# Patient Record
Sex: Male | Born: 1935 | Race: White | Hispanic: No
Health system: Southern US, Community
[De-identification: ages and names within clinical notes are randomized; demographics above are authoritative.]

---

## 2002-08-21 ENCOUNTER — Encounter: Admission: RE | Admit: 2002-08-21 | Discharge: 2002-08-21 | Payer: Self-pay | Admitting: Unknown Physician Specialty

## 2002-08-21 ENCOUNTER — Encounter: Payer: Self-pay | Admitting: Unknown Physician Specialty

## 2003-02-17 ENCOUNTER — Encounter: Admission: RE | Admit: 2003-02-17 | Discharge: 2003-02-17 | Payer: Self-pay | Admitting: Neurosurgery

## 2003-02-17 ENCOUNTER — Encounter: Payer: Self-pay | Admitting: Neurosurgery

## 2003-03-20 ENCOUNTER — Encounter: Payer: Self-pay | Admitting: Neurosurgery

## 2003-03-25 ENCOUNTER — Encounter: Payer: Self-pay | Admitting: Neurosurgery

## 2003-03-25 ENCOUNTER — Inpatient Hospital Stay (HOSPITAL_COMMUNITY): Admission: RE | Admit: 2003-03-25 | Discharge: 2003-03-26 | Payer: Self-pay | Admitting: Neurosurgery

## 2003-04-15 ENCOUNTER — Encounter: Admission: RE | Admit: 2003-04-15 | Discharge: 2003-04-15 | Payer: Self-pay | Admitting: Neurosurgery

## 2003-04-15 ENCOUNTER — Encounter: Payer: Self-pay | Admitting: Neurosurgery

## 2003-09-25 ENCOUNTER — Inpatient Hospital Stay (HOSPITAL_COMMUNITY): Admission: RE | Admit: 2003-09-25 | Discharge: 2003-09-26 | Payer: Self-pay | Admitting: Neurosurgery

## 2004-09-10 ENCOUNTER — Ambulatory Visit: Payer: Self-pay | Admitting: Gastroenterology

## 2006-02-23 ENCOUNTER — Ambulatory Visit: Payer: Self-pay | Admitting: Ophthalmology

## 2006-02-28 ENCOUNTER — Ambulatory Visit: Payer: Self-pay | Admitting: Ophthalmology

## 2010-06-07 ENCOUNTER — Emergency Department: Payer: Self-pay | Admitting: Emergency Medicine

## 2011-02-22 ENCOUNTER — Emergency Department: Payer: Self-pay | Admitting: Emergency Medicine

## 2011-02-24 ENCOUNTER — Ambulatory Visit: Payer: Self-pay | Admitting: Cardiothoracic Surgery

## 2011-03-06 ENCOUNTER — Ambulatory Visit: Payer: Self-pay | Admitting: Cardiothoracic Surgery

## 2011-10-13 ENCOUNTER — Inpatient Hospital Stay: Payer: Self-pay | Admitting: Family Medicine

## 2011-10-13 LAB — CBC
HGB: 14.8 g/dL (ref 13.0–18.0)
Platelet: 242 10*3/uL (ref 150–440)
RBC: 4.59 10*6/uL (ref 4.40–5.90)
WBC: 9.9 10*3/uL (ref 3.8–10.6)

## 2011-10-13 LAB — CK TOTAL AND CKMB (NOT AT ARMC)
CK, Total: 151 U/L (ref 35–232)
CK-MB: 4.1 ng/mL — ABNORMAL HIGH (ref 0.5–3.6)

## 2011-10-13 LAB — CBC WITH DIFFERENTIAL/PLATELET
Basophil #: 0.1 10*3/uL (ref 0.0–0.1)
Basophil %: 0.8 %
Eosinophil #: 0.1 10*3/uL (ref 0.0–0.7)
HCT: 40.6 % (ref 40.0–52.0)
Lymphocyte %: 7.2 %
MCHC: 32.8 g/dL (ref 32.0–36.0)
Monocyte #: 0.4 10*3/uL (ref 0.0–0.7)
Neutrophil #: 5.9 10*3/uL (ref 1.4–6.5)
RDW: 18.8 % — ABNORMAL HIGH (ref 11.5–14.5)
WBC: 7 10*3/uL (ref 3.8–10.6)

## 2011-10-13 LAB — TSH: Thyroid Stimulating Horm: 1.82 u[IU]/mL

## 2011-10-13 LAB — COMPREHENSIVE METABOLIC PANEL
Albumin: 3.9 g/dL (ref 3.4–5.0)
Anion Gap: 15 (ref 7–16)
Bilirubin,Total: 2.4 mg/dL — ABNORMAL HIGH (ref 0.2–1.0)
Calcium, Total: 9.2 mg/dL (ref 8.5–10.1)
Glucose: 108 mg/dL — ABNORMAL HIGH (ref 65–99)
Osmolality: 289 (ref 275–301)
Sodium: 140 mmol/L (ref 136–145)
Total Protein: 7.6 g/dL (ref 6.4–8.2)

## 2011-10-13 LAB — PROTIME-INR
INR: 1.4
Prothrombin Time: 17.1 secs — ABNORMAL HIGH (ref 11.5–14.7)

## 2011-10-13 LAB — PRO B NATRIURETIC PEPTIDE: B-Type Natriuretic Peptide: 14579 pg/mL — ABNORMAL HIGH (ref 0–450)

## 2011-10-13 LAB — MAGNESIUM: Magnesium: 1.9 mg/dL

## 2011-10-13 LAB — TROPONIN I: Troponin-I: 0.05 ng/mL

## 2011-10-13 LAB — PHOSPHORUS: Phosphorus: 3 mg/dL (ref 2.5–4.9)

## 2011-10-14 LAB — URINALYSIS, COMPLETE
Bacteria: NONE SEEN
Bilirubin,UR: NEGATIVE
Blood: NEGATIVE
Glucose,UR: NEGATIVE mg/dL (ref 0–75)
Ketone: NEGATIVE
Leukocyte Esterase: NEGATIVE
Ph: 6 (ref 4.5–8.0)
Specific Gravity: 1.005 (ref 1.003–1.030)
Squamous Epithelial: NONE SEEN

## 2011-10-14 LAB — CBC WITH DIFFERENTIAL/PLATELET
Basophil #: 0 10*3/uL (ref 0.0–0.1)
Eosinophil #: 0.3 10*3/uL (ref 0.0–0.7)
HGB: 12.4 g/dL — ABNORMAL LOW (ref 13.0–18.0)
Lymphocyte %: 7 %
MCHC: 32.6 g/dL (ref 32.0–36.0)
Monocyte #: 0.5 10*3/uL (ref 0.0–0.7)
Monocyte %: 6.5 %
Neutrophil #: 6.2 10*3/uL (ref 1.4–6.5)
Neutrophil %: 82.2 %
Platelet: 187 10*3/uL (ref 150–440)
RBC: 3.81 10*6/uL — ABNORMAL LOW (ref 4.40–5.90)
RDW: 19.1 % — ABNORMAL HIGH (ref 11.5–14.5)
WBC: 7.5 10*3/uL (ref 3.8–10.6)

## 2011-10-14 LAB — DRUG SCREEN, URINE
Amphetamines, Ur Screen: NEGATIVE (ref ?–1000)
Benzodiazepine, Ur Scrn: NEGATIVE (ref ?–200)
Cocaine Metabolite,Ur ~~LOC~~: NEGATIVE (ref ?–300)
MDMA (Ecstasy)Ur Screen: NEGATIVE (ref ?–500)
Methadone, Ur Screen: NEGATIVE (ref ?–300)
Opiate, Ur Screen: NEGATIVE (ref ?–300)
Phencyclidine (PCP) Ur S: NEGATIVE (ref ?–25)
Tricyclic, Ur Screen: NEGATIVE (ref ?–1000)

## 2011-10-14 LAB — BASIC METABOLIC PANEL
Anion Gap: 13 (ref 7–16)
BUN: 30 mg/dL — ABNORMAL HIGH (ref 7–18)
Calcium, Total: 7.9 mg/dL — ABNORMAL LOW (ref 8.5–10.1)
Chloride: 100 mmol/L (ref 98–107)
Co2: 29 mmol/L (ref 21–32)
Creatinine: 1.24 mg/dL (ref 0.60–1.30)
EGFR (African American): >60
Potassium: 3.3 mmol/L — ABNORMAL LOW (ref 3.5–5.1)
Sodium: 142 mmol/L (ref 136–145)

## 2011-10-14 LAB — CK TOTAL AND CKMB (NOT AT ARMC)
CK, Total: 136 U/L (ref 35–232)
CK-MB: 3.2 ng/mL (ref 0.5–3.6)

## 2011-10-14 LAB — TROPONIN I: Troponin-I: 0.05 ng/mL

## 2011-10-14 LAB — ETHANOL: Ethanol %: <0.003 % (ref 0.000–0.080)

## 2011-10-15 LAB — BASIC METABOLIC PANEL
Calcium, Total: 8 mg/dL — ABNORMAL LOW (ref 8.5–10.1)
Chloride: 99 mmol/L (ref 98–107)
Co2: 29 mmol/L (ref 21–32)
EGFR (African American): >60
EGFR (Non-African Amer.): 54 — ABNORMAL LOW
Potassium: 3.9 mmol/L (ref 3.5–5.1)
Sodium: 138 mmol/L (ref 136–145)

## 2011-10-15 LAB — CBC WITH DIFFERENTIAL/PLATELET
Basophil #: 0.1 10*3/uL (ref 0.0–0.1)
Eosinophil #: 0.4 10*3/uL (ref 0.0–0.7)
Eosinophil %: 4.7 %
HCT: 38.5 % — ABNORMAL LOW (ref 40.0–52.0)
Lymphocyte #: 0.6 10*3/uL — ABNORMAL LOW (ref 1.0–3.6)
Lymphocyte %: 7.9 %
MCH: 32.1 pg (ref 26.0–34.0)
MCHC: 32.3 g/dL (ref 32.0–36.0)
MCV: 100 fL (ref 80–100)
Monocyte #: 0.5 10*3/uL (ref 0.0–0.7)
Neutrophil #: 6.5 10*3/uL (ref 1.4–6.5)
Platelet: 194 10*3/uL (ref 150–440)
RDW: 19.5 % — ABNORMAL HIGH (ref 11.5–14.5)
WBC: 8 10*3/uL (ref 3.8–10.6)

## 2011-10-15 LAB — APTT
Activated PTT: 116.2 secs — ABNORMAL HIGH (ref 23.6–35.9)
Activated PTT: 103.5 secs — ABNORMAL HIGH (ref 23.6–35.9)

## 2011-10-16 LAB — BASIC METABOLIC PANEL
Anion Gap: 11 (ref 7–16)
BUN: 21 mg/dL — ABNORMAL HIGH (ref 7–18)
Calcium, Total: 8.2 mg/dL — ABNORMAL LOW (ref 8.5–10.1)
Co2: 32 mmol/L (ref 21–32)
EGFR (African American): >60
Glucose: 141 mg/dL — ABNORMAL HIGH (ref 65–99)

## 2011-10-16 LAB — MAGNESIUM: Magnesium: 1 mg/dL — ABNORMAL LOW

## 2011-10-16 LAB — APTT: Activated PTT: 80.1 secs — ABNORMAL HIGH (ref 23.6–35.9)

## 2011-10-17 LAB — HEPATIC FUNCTION PANEL A (ARMC)
Albumin: 3 g/dL — ABNORMAL LOW (ref 3.4–5.0)
Bilirubin,Total: 1.2 mg/dL — ABNORMAL HIGH (ref 0.2–1.0)
SGOT(AST): 54 U/L — ABNORMAL HIGH (ref 15–37)
SGPT (ALT): 135 U/L — ABNORMAL HIGH
Total Protein: 6.7 g/dL (ref 6.4–8.2)

## 2011-10-17 LAB — BASIC METABOLIC PANEL
Anion Gap: 10 (ref 7–16)
Calcium, Total: 8.4 mg/dL — ABNORMAL LOW (ref 8.5–10.1)
Chloride: 94 mmol/L — ABNORMAL LOW (ref 98–107)
Creatinine: 1.13 mg/dL (ref 0.60–1.30)
EGFR (African American): >60
EGFR (Non-African Amer.): >60
Glucose: 124 mg/dL — ABNORMAL HIGH (ref 65–99)
Sodium: 134 mmol/L — ABNORMAL LOW (ref 136–145)

## 2011-10-17 LAB — PROTIME-INR
INR: 1
Prothrombin Time: 13.4 secs (ref 11.5–14.7)

## 2011-10-18 LAB — CBC WITH DIFFERENTIAL/PLATELET
Eosinophil #: 0.4 10*3/uL (ref 0.0–0.7)
Eosinophil %: 5.4 %
Lymphocyte #: 0.9 10*3/uL — ABNORMAL LOW (ref 1.0–3.6)
MCHC: 32.9 g/dL (ref 32.0–36.0)
Monocyte #: 0.7 10*3/uL (ref 0.0–0.7)
Neutrophil #: 6.2 10*3/uL (ref 1.4–6.5)
Neutrophil %: 74.9 %
RDW: 19.7 % — ABNORMAL HIGH (ref 11.5–14.5)
WBC: 8.3 10*3/uL (ref 3.8–10.6)

## 2011-10-18 LAB — BASIC METABOLIC PANEL
Calcium, Total: 8.6 mg/dL (ref 8.5–10.1)
Chloride: 95 mmol/L — ABNORMAL LOW (ref 98–107)
Co2: 32 mmol/L (ref 21–32)
Creatinine: 1.21 mg/dL (ref 0.60–1.30)
EGFR (African American): >60
EGFR (Non-African Amer.): >60
Osmolality: 271 (ref 275–301)
Potassium: 3.9 mmol/L (ref 3.5–5.1)
Sodium: 133 mmol/L — ABNORMAL LOW (ref 136–145)

## 2011-10-18 LAB — PROTIME-INR
INR: 1.3
Prothrombin Time: 16.2 secs — ABNORMAL HIGH (ref 11.5–14.7)

## 2011-10-18 LAB — MAGNESIUM: Magnesium: 1.7 mg/dL — ABNORMAL LOW

## 2011-10-19 LAB — PROTIME-INR: Prothrombin Time: 16.7 secs — ABNORMAL HIGH (ref 11.5–14.7)

## 2011-10-19 LAB — BASIC METABOLIC PANEL
Anion Gap: 9 (ref 7–16)
BUN: 21 mg/dL — ABNORMAL HIGH (ref 7–18)
Chloride: 94 mmol/L — ABNORMAL LOW (ref 98–107)
Co2: 30 mmol/L (ref 21–32)
Osmolality: 271 (ref 275–301)

## 2011-11-24 ENCOUNTER — Inpatient Hospital Stay: Payer: Self-pay | Admitting: Family Medicine

## 2011-11-24 LAB — CBC
HGB: 12.5 g/dL — ABNORMAL LOW (ref 13.0–18.0)
MCV: 92 fL (ref 80–100)
Platelet: 223 10*3/uL (ref 150–440)
RBC: 4.15 10*6/uL — ABNORMAL LOW (ref 4.40–5.90)
WBC: 7.2 10*3/uL (ref 3.8–10.6)

## 2011-11-24 LAB — COMPREHENSIVE METABOLIC PANEL
Albumin: 3.2 g/dL — ABNORMAL LOW (ref 3.4–5.0)
Alkaline Phosphatase: 75 U/L (ref 50–136)
BUN: 20 mg/dL — ABNORMAL HIGH (ref 7–18)
Bilirubin,Total: 0.7 mg/dL (ref 0.2–1.0)
Chloride: 99 mmol/L (ref 98–107)
Co2: 27 mmol/L (ref 21–32)
Creatinine: 1.06 mg/dL (ref 0.60–1.30)
EGFR (African American): >60
EGFR (Non-African Amer.): >60
Osmolality: 281 (ref 275–301)
Potassium: 4.2 mmol/L (ref 3.5–5.1)
SGPT (ALT): 13 U/L
Sodium: 139 mmol/L (ref 136–145)
Total Protein: 7.2 g/dL (ref 6.4–8.2)

## 2011-11-24 LAB — TROPONIN I: Troponin-I: <0.02 ng/mL

## 2011-11-24 LAB — CK TOTAL AND CKMB (NOT AT ARMC)
CK, Total: 97 U/L (ref 35–232)
CK-MB: 1 ng/mL (ref 0.5–3.6)

## 2011-11-25 LAB — BASIC METABOLIC PANEL
BUN: 19 mg/dL — ABNORMAL HIGH (ref 7–18)
Chloride: 100 mmol/L (ref 98–107)
Co2: 30 mmol/L (ref 21–32)
Creatinine: 1.11 mg/dL (ref 0.60–1.30)
EGFR (African American): >60
EGFR (Non-African Amer.): >60
Glucose: 139 mg/dL — ABNORMAL HIGH (ref 65–99)
Potassium: 4.2 mmol/L (ref 3.5–5.1)

## 2011-11-25 LAB — URINALYSIS, COMPLETE
Bacteria: NONE SEEN
Blood: NEGATIVE
Glucose,UR: NEGATIVE mg/dL (ref 0–75)
Ketone: NEGATIVE
Nitrite: POSITIVE
Protein: NEGATIVE
RBC,UR: 2 /HPF (ref 0–5)
Squamous Epithelial: <1

## 2011-11-25 LAB — TROPONIN I: Troponin-I: <0.02 ng/mL

## 2011-11-25 LAB — CK TOTAL AND CKMB (NOT AT ARMC): CK, Total: 71 U/L (ref 35–232)

## 2011-11-26 LAB — BASIC METABOLIC PANEL
Anion Gap: 8 (ref 7–16)
BUN: 17 mg/dL (ref 7–18)
Co2: 32 mmol/L (ref 21–32)
Creatinine: 1.07 mg/dL (ref 0.60–1.30)
EGFR (African American): >60
EGFR (Non-African Amer.): >60
Sodium: 134 mmol/L — ABNORMAL LOW (ref 136–145)

## 2011-11-26 LAB — CBC WITH DIFFERENTIAL/PLATELET
Basophil #: 0 10*3/uL (ref 0.0–0.1)
Basophil %: 0.4 %
Eosinophil #: 0.4 10*3/uL (ref 0.0–0.7)
Lymphocyte #: 1.5 10*3/uL (ref 1.0–3.6)
MCH: 29.9 pg (ref 26.0–34.0)
MCHC: 32.4 g/dL (ref 32.0–36.0)
MCV: 92 fL (ref 80–100)
Monocyte #: 0.5 10*3/uL (ref 0.0–0.7)
Monocyte %: 7.7 %
Neutrophil #: 4.5 10*3/uL (ref 1.4–6.5)
RBC: 4.31 10*6/uL — ABNORMAL LOW (ref 4.40–5.90)
RDW: 23.6 % — ABNORMAL HIGH (ref 11.5–14.5)

## 2011-11-26 LAB — PROTIME-INR: INR: 2.5

## 2011-11-28 LAB — BASIC METABOLIC PANEL
BUN: 17 mg/dL (ref 7–18)
Chloride: 96 mmol/L — ABNORMAL LOW (ref 98–107)
Co2: 31 mmol/L (ref 21–32)
EGFR (Non-African Amer.): >60
Osmolality: 277 (ref 275–301)
Potassium: 3.7 mmol/L (ref 3.5–5.1)

## 2011-11-28 LAB — CBC WITH DIFFERENTIAL/PLATELET
Basophil #: 0.1 10*3/uL (ref 0.0–0.1)
HGB: 12.4 g/dL — ABNORMAL LOW (ref 13.0–18.0)
Lymphocyte #: 1.1 10*3/uL (ref 1.0–3.6)
MCH: 29.7 pg (ref 26.0–34.0)
MCHC: 32 g/dL (ref 32.0–36.0)
MCV: 93 fL (ref 80–100)
Neutrophil #: 4.3 10*3/uL (ref 1.4–6.5)
Neutrophil %: 67.1 %
RBC: 4.18 10*6/uL — ABNORMAL LOW (ref 4.40–5.90)
RDW: 22.8 % — ABNORMAL HIGH (ref 11.5–14.5)
WBC: 6.4 10*3/uL (ref 3.8–10.6)

## 2011-11-28 LAB — MAGNESIUM: Magnesium: 2 mg/dL

## 2011-11-29 LAB — CBC WITH DIFFERENTIAL/PLATELET
Basophil #: 0.1 10*3/uL (ref 0.0–0.1)
Eosinophil #: 0.4 10*3/uL (ref 0.0–0.7)
Eosinophil %: 7 %
HCT: 37.6 % — ABNORMAL LOW (ref 40.0–52.0)
Lymphocyte #: 1.1 10*3/uL (ref 1.0–3.6)
Lymphocyte %: 18.2 %
MCH: 30.3 pg (ref 26.0–34.0)
MCHC: 32.9 g/dL (ref 32.0–36.0)
MCV: 92 fL (ref 80–100)
Monocyte %: 8.4 %
Neutrophil %: 64.8 %
RBC: 4.09 10*6/uL — ABNORMAL LOW (ref 4.40–5.90)
RDW: 22.9 % — ABNORMAL HIGH (ref 11.5–14.5)
WBC: 6.2 10*3/uL (ref 3.8–10.6)

## 2011-11-29 LAB — BASIC METABOLIC PANEL
BUN: 17 mg/dL (ref 7–18)
Chloride: 98 mmol/L (ref 98–107)
Co2: 28 mmol/L (ref 21–32)
EGFR (African American): >60
EGFR (Non-African Amer.): >60
Glucose: 147 mg/dL — ABNORMAL HIGH (ref 65–99)
Osmolality: 280 (ref 275–301)
Potassium: 3.9 mmol/L (ref 3.5–5.1)
Sodium: 138 mmol/L (ref 136–145)

## 2011-11-29 LAB — PROTIME-INR: Prothrombin Time: 33.5 secs — ABNORMAL HIGH (ref 11.5–14.7)

## 2011-11-30 LAB — BASIC METABOLIC PANEL
Anion Gap: 8 (ref 7–16)
BUN: 16 mg/dL (ref 7–18)
Calcium, Total: 9.4 mg/dL (ref 8.5–10.1)
Chloride: 98 mmol/L (ref 98–107)
Creatinine: 1.12 mg/dL (ref 0.60–1.30)
EGFR (African American): >60
EGFR (Non-African Amer.): >60
Glucose: 118 mg/dL — ABNORMAL HIGH (ref 65–99)
Potassium: 4.9 mmol/L (ref 3.5–5.1)
Sodium: 136 mmol/L (ref 136–145)

## 2011-11-30 LAB — CBC WITH DIFFERENTIAL/PLATELET
Basophil #: 0 10*3/uL (ref 0.0–0.1)
Basophil %: 0.5 %
HCT: 37.8 % — ABNORMAL LOW (ref 40.0–52.0)
Lymphocyte %: 19.3 %
MCHC: 32.2 g/dL (ref 32.0–36.0)
Monocyte #: 0.6 10*3/uL (ref 0.0–0.7)
Monocyte %: 8.4 %
Neutrophil #: 4.5 10*3/uL (ref 1.4–6.5)
Platelet: 240 10*3/uL (ref 150–440)
RBC: 4.08 10*6/uL — ABNORMAL LOW (ref 4.40–5.90)
RDW: 22.7 % — ABNORMAL HIGH (ref 11.5–14.5)
WBC: 6.9 10*3/uL (ref 3.8–10.6)

## 2011-11-30 LAB — PROTIME-INR: Prothrombin Time: 32.3 secs — ABNORMAL HIGH (ref 11.5–14.7)

## 2011-11-30 LAB — CULTURE, BLOOD (SINGLE)

## 2011-12-01 LAB — BASIC METABOLIC PANEL
Anion Gap: 9 (ref 7–16)
BUN: 15 mg/dL (ref 7–18)
Chloride: 98 mmol/L (ref 98–107)
Co2: 28 mmol/L (ref 21–32)
Creatinine: 1.05 mg/dL (ref 0.60–1.30)
EGFR (African American): >60
EGFR (Non-African Amer.): >60
Osmolality: 271 (ref 275–301)
Potassium: 4.4 mmol/L (ref 3.5–5.1)
Sodium: 135 mmol/L — ABNORMAL LOW (ref 136–145)

## 2011-12-01 LAB — PROTIME-INR: Prothrombin Time: 29.8 secs — ABNORMAL HIGH (ref 11.5–14.7)

## 2011-12-18 ENCOUNTER — Inpatient Hospital Stay: Payer: Self-pay | Admitting: Internal Medicine

## 2011-12-18 LAB — CBC
HGB: 12 g/dL — ABNORMAL LOW (ref 13.0–18.0)
MCH: 30 pg (ref 26.0–34.0)
MCHC: 32.7 g/dL (ref 32.0–36.0)
MCV: 92 fL (ref 80–100)
Platelet: 254 10*3/uL (ref 150–440)
RBC: 4 10*6/uL — ABNORMAL LOW (ref 4.40–5.90)
RDW: 22 % — ABNORMAL HIGH (ref 11.5–14.5)

## 2011-12-18 LAB — COMPREHENSIVE METABOLIC PANEL
Albumin: 3.1 g/dL — ABNORMAL LOW (ref 3.4–5.0)
Alkaline Phosphatase: 81 U/L (ref 50–136)
Bilirubin,Total: 1.4 mg/dL — ABNORMAL HIGH (ref 0.2–1.0)
Calcium, Total: 8.9 mg/dL (ref 8.5–10.1)
Creatinine: 0.89 mg/dL (ref 0.60–1.30)
Glucose: 128 mg/dL — ABNORMAL HIGH (ref 65–99)
Osmolality: 280 (ref 275–301)
Potassium: 4.2 mmol/L (ref 3.5–5.1)
SGOT(AST): 24 U/L (ref 15–37)
SGPT (ALT): 12 U/L
Total Protein: 7.5 g/dL (ref 6.4–8.2)

## 2011-12-18 LAB — PROTIME-INR
INR: 1.5
Prothrombin Time: 18.3 secs — ABNORMAL HIGH (ref 11.5–14.7)

## 2011-12-18 LAB — TROPONIN I: Troponin-I: <0.02 ng/mL

## 2011-12-18 LAB — PRO B NATRIURETIC PEPTIDE: B-Type Natriuretic Peptide: 9756 pg/mL — ABNORMAL HIGH (ref 0–450)

## 2011-12-19 LAB — CBC WITH DIFFERENTIAL/PLATELET
Basophil %: 0.5 %
Eosinophil #: 0.2 10*3/uL (ref 0.0–0.7)
Lymphocyte #: 1.2 10*3/uL (ref 1.0–3.6)
MCV: 91 fL (ref 80–100)
Monocyte %: 8.1 %
Neutrophil %: 75.9 %
Platelet: 220 10*3/uL (ref 150–440)
RBC: 3.85 10*6/uL — ABNORMAL LOW (ref 4.40–5.90)
RDW: 21.9 % — ABNORMAL HIGH (ref 11.5–14.5)
WBC: 9.2 10*3/uL (ref 3.8–10.6)

## 2011-12-19 LAB — TROPONIN I
Troponin-I: <0.02 ng/mL
Troponin-I: <0.02 ng/mL

## 2011-12-19 LAB — BASIC METABOLIC PANEL
BUN: 17 mg/dL (ref 7–18)
Calcium, Total: 8.5 mg/dL (ref 8.5–10.1)
Chloride: 101 mmol/L (ref 98–107)
Co2: 26 mmol/L (ref 21–32)
Creatinine: 0.96 mg/dL (ref 0.60–1.30)
EGFR (Non-African Amer.): >60
Glucose: 116 mg/dL — ABNORMAL HIGH (ref 65–99)
Osmolality: 278 (ref 275–301)
Potassium: 3.5 mmol/L (ref 3.5–5.1)
Sodium: 138 mmol/L (ref 136–145)

## 2011-12-19 LAB — LIPID PANEL
HDL Cholesterol: 38 mg/dL — ABNORMAL LOW (ref 40–60)
Triglycerides: 79 mg/dL (ref 0–200)
VLDL Cholesterol, Calc: 16 mg/dL (ref 5–40)

## 2011-12-20 LAB — PROTIME-INR
INR: 2.2
Prothrombin Time: 24.7 secs — ABNORMAL HIGH (ref 11.5–14.7)

## 2011-12-20 LAB — CBC WITH DIFFERENTIAL/PLATELET
Basophil #: 0.1 10*3/uL (ref 0.0–0.1)
Basophil %: 0.6 %
Eosinophil #: 0.4 10*3/uL (ref 0.0–0.7)
Eosinophil %: 4.9 %
HCT: 34.8 % — ABNORMAL LOW (ref 40.0–52.0)
Lymphocyte #: 1.6 10*3/uL (ref 1.0–3.6)
Lymphocyte %: 17.6 %
MCV: 90 fL (ref 80–100)
Monocyte #: 0.7 x10 3/mm (ref 0.2–1.0)
Monocyte %: 7.9 %
Neutrophil #: 6.2 10*3/uL (ref 1.4–6.5)
RDW: 22.2 % — ABNORMAL HIGH (ref 11.5–14.5)

## 2011-12-20 LAB — BASIC METABOLIC PANEL
BUN: 18 mg/dL (ref 7–18)
Chloride: 98 mmol/L (ref 98–107)
Co2: 28 mmol/L (ref 21–32)
Creatinine: 1.07 mg/dL (ref 0.60–1.30)
EGFR (African American): >60
Osmolality: 273 (ref 275–301)

## 2011-12-21 LAB — BASIC METABOLIC PANEL
Anion Gap: 10 (ref 7–16)
BUN: 19 mg/dL — ABNORMAL HIGH (ref 7–18)
Co2: 30 mmol/L (ref 21–32)
EGFR (African American): >60
EGFR (Non-African Amer.): 59 — ABNORMAL LOW
Glucose: 110 mg/dL — ABNORMAL HIGH (ref 65–99)
Sodium: 136 mmol/L (ref 136–145)

## 2011-12-21 LAB — PROTIME-INR: Prothrombin Time: 29.1 secs — ABNORMAL HIGH (ref 11.5–14.7)

## 2011-12-22 LAB — BASIC METABOLIC PANEL
Anion Gap: 8 (ref 7–16)
Calcium, Total: 9.1 mg/dL (ref 8.5–10.1)
Creatinine: 1.15 mg/dL (ref 0.60–1.30)
EGFR (African American): >60
EGFR (Non-African Amer.): 59 — ABNORMAL LOW
Osmolality: 272 (ref 275–301)
Potassium: 4 mmol/L (ref 3.5–5.1)
Sodium: 133 mmol/L — ABNORMAL LOW (ref 136–145)

## 2011-12-22 LAB — CBC WITH DIFFERENTIAL/PLATELET
Basophil %: 1.3 %
Eosinophil #: 0.5 10*3/uL (ref 0.0–0.7)
HGB: 12.4 g/dL — ABNORMAL LOW (ref 13.0–18.0)
Lymphocyte #: 1.4 10*3/uL (ref 1.0–3.6)
Lymphocyte %: 15.6 %
MCH: 29.2 pg (ref 26.0–34.0)
Monocyte #: 0.7 x10 3/mm (ref 0.2–1.0)
Neutrophil %: 69.9 %
Platelet: 341 10*3/uL (ref 150–440)
RBC: 4.25 10*6/uL — ABNORMAL LOW (ref 4.40–5.90)
RDW: 21.9 % — ABNORMAL HIGH (ref 11.5–14.5)

## 2011-12-22 LAB — PROTIME-INR: Prothrombin Time: 37.1 secs — ABNORMAL HIGH (ref 11.5–14.7)

## 2011-12-23 LAB — BASIC METABOLIC PANEL
BUN: 25 mg/dL — ABNORMAL HIGH (ref 7–18)
Calcium, Total: 9.1 mg/dL (ref 8.5–10.1)
Chloride: 99 mmol/L (ref 98–107)
Creatinine: 1.24 mg/dL (ref 0.60–1.30)
EGFR (African American): >60
Glucose: 121 mg/dL — ABNORMAL HIGH (ref 65–99)
Osmolality: 281 (ref 275–301)
Sodium: 138 mmol/L (ref 136–145)

## 2011-12-23 LAB — PROTIME-INR
INR: 3.1
Prothrombin Time: 32.1 secs — ABNORMAL HIGH (ref 11.5–14.7)

## 2011-12-24 LAB — MAGNESIUM: Magnesium: 2 mg/dL

## 2011-12-24 LAB — BASIC METABOLIC PANEL
BUN: 24 mg/dL — ABNORMAL HIGH (ref 7–18)
EGFR (Non-African Amer.): 59 — ABNORMAL LOW
Glucose: 107 mg/dL — ABNORMAL HIGH (ref 65–99)
Potassium: 4.1 mmol/L (ref 3.5–5.1)
Sodium: 138 mmol/L (ref 136–145)

## 2011-12-24 LAB — CULTURE, BLOOD (SINGLE)

## 2011-12-25 LAB — BASIC METABOLIC PANEL
Anion Gap: 10 (ref 7–16)
BUN: 25 mg/dL — ABNORMAL HIGH (ref 7–18)
Calcium, Total: 9.2 mg/dL (ref 8.5–10.1)
Co2: 31 mmol/L (ref 21–32)
EGFR (African American): >60
EGFR (Non-African Amer.): 55 — ABNORMAL LOW
Glucose: 133 mg/dL — ABNORMAL HIGH (ref 65–99)
Osmolality: 286 (ref 275–301)

## 2011-12-25 LAB — PROTIME-INR: Prothrombin Time: 31.9 secs — ABNORMAL HIGH (ref 11.5–14.7)

## 2011-12-26 LAB — BASIC METABOLIC PANEL
Anion Gap: 5 — ABNORMAL LOW (ref 7–16)
Chloride: 96 mmol/L — ABNORMAL LOW (ref 98–107)
Co2: 34 mmol/L — ABNORMAL HIGH (ref 21–32)
Creatinine: 1.12 mg/dL (ref 0.60–1.30)
EGFR (African American): >60
Osmolality: 273 (ref 275–301)
Sodium: 135 mmol/L — ABNORMAL LOW (ref 136–145)

## 2012-04-05 DEATH — deceased

## 2013-09-22 IMAGING — CR DG CHEST 2V
1 series · 3 of 3 positions shown · non-contrast
Comparison: none

REASON FOR EXAM: tachy
COMMENTS:   May transport without cardiac monitor

[Series 1: x chest ap · 0.14mm/px · 3 of 3 slices shown]
[im 1/3]
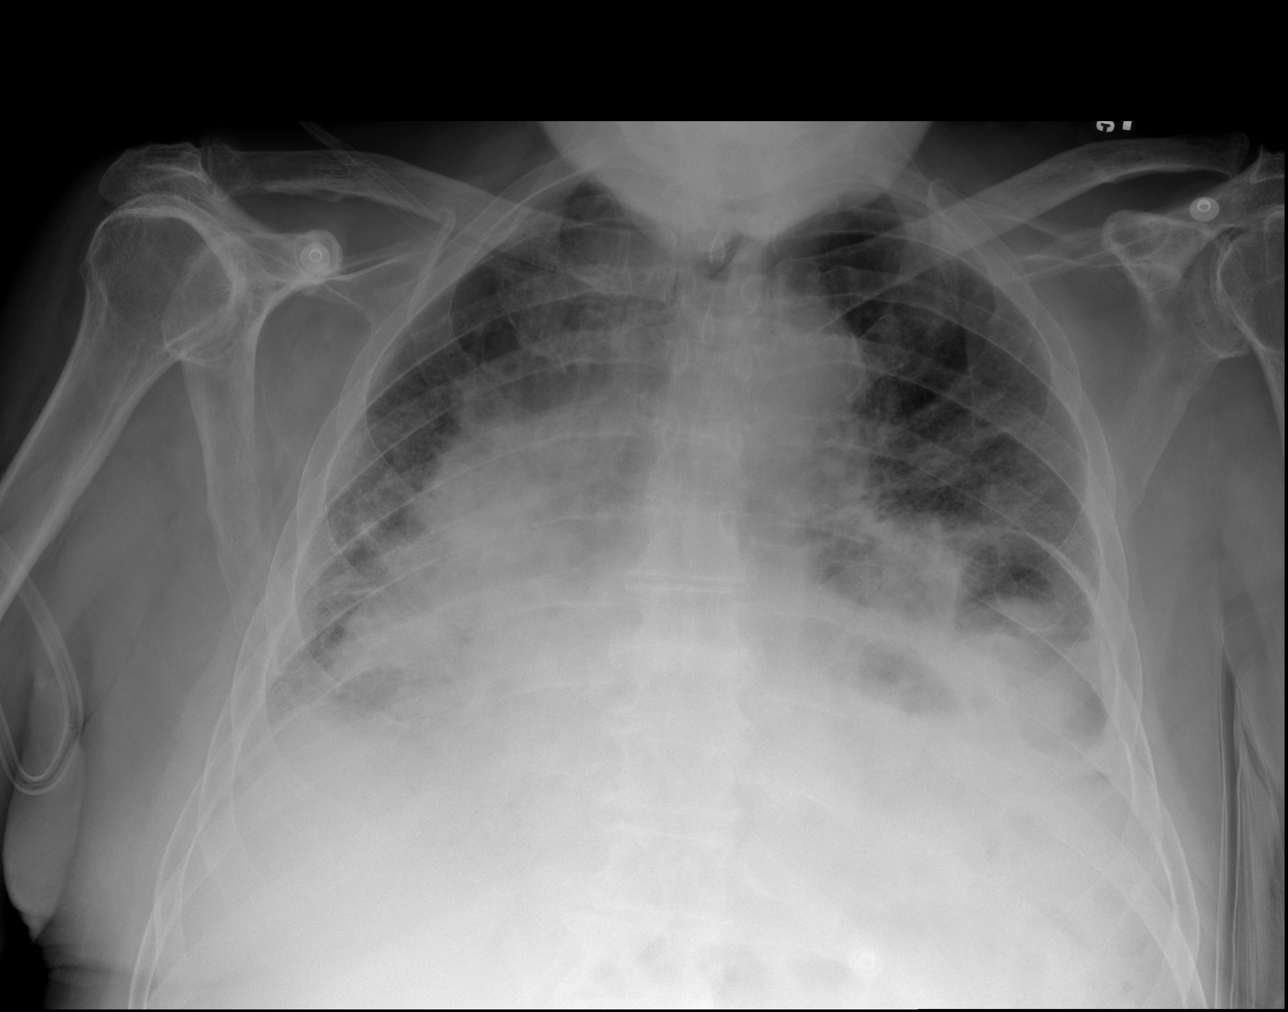
[im 2/3]
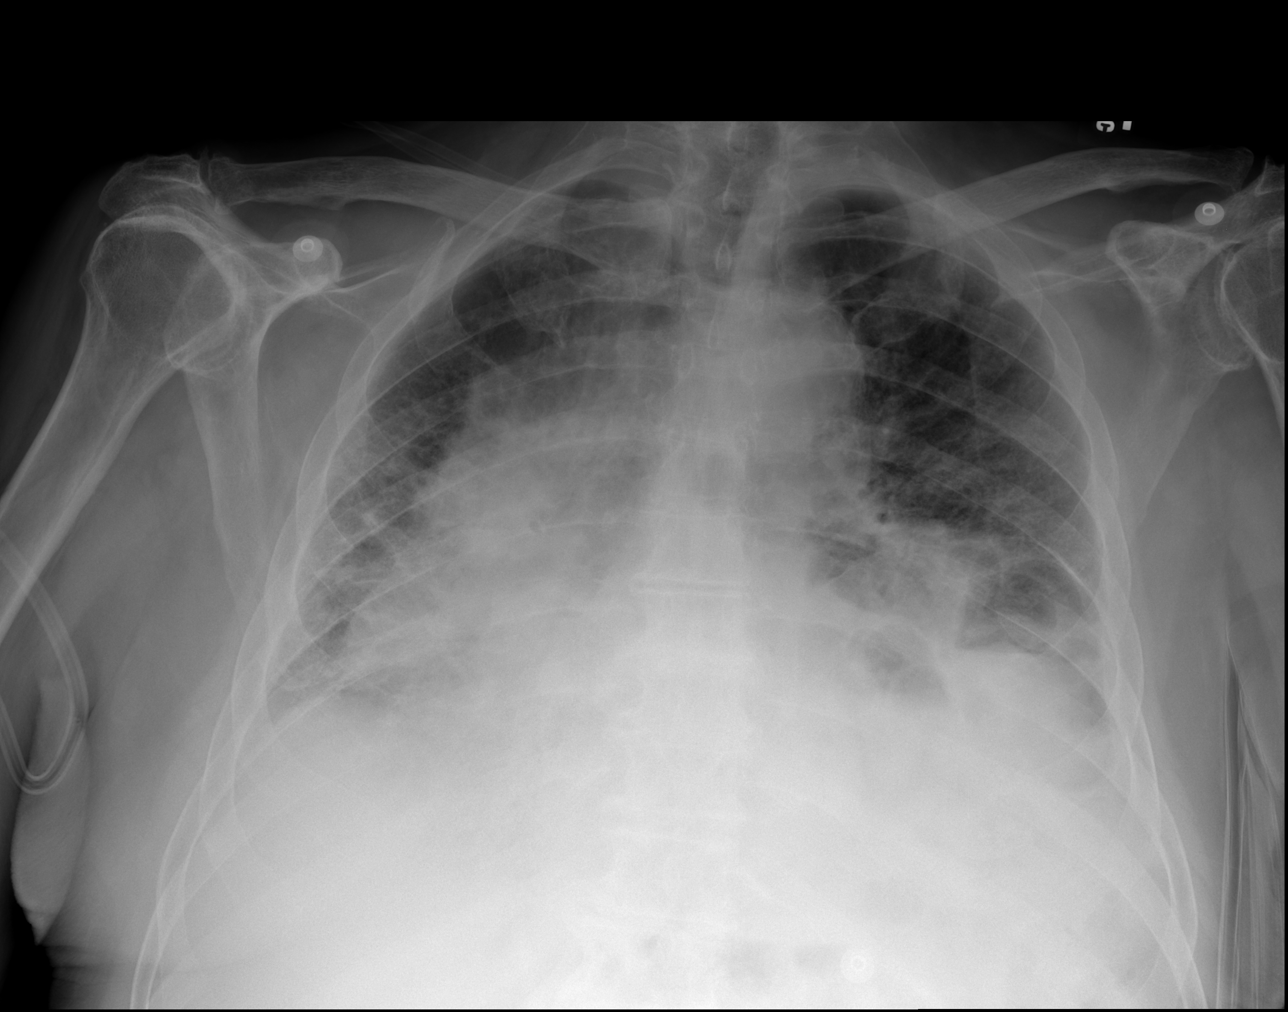
[im 3/3]
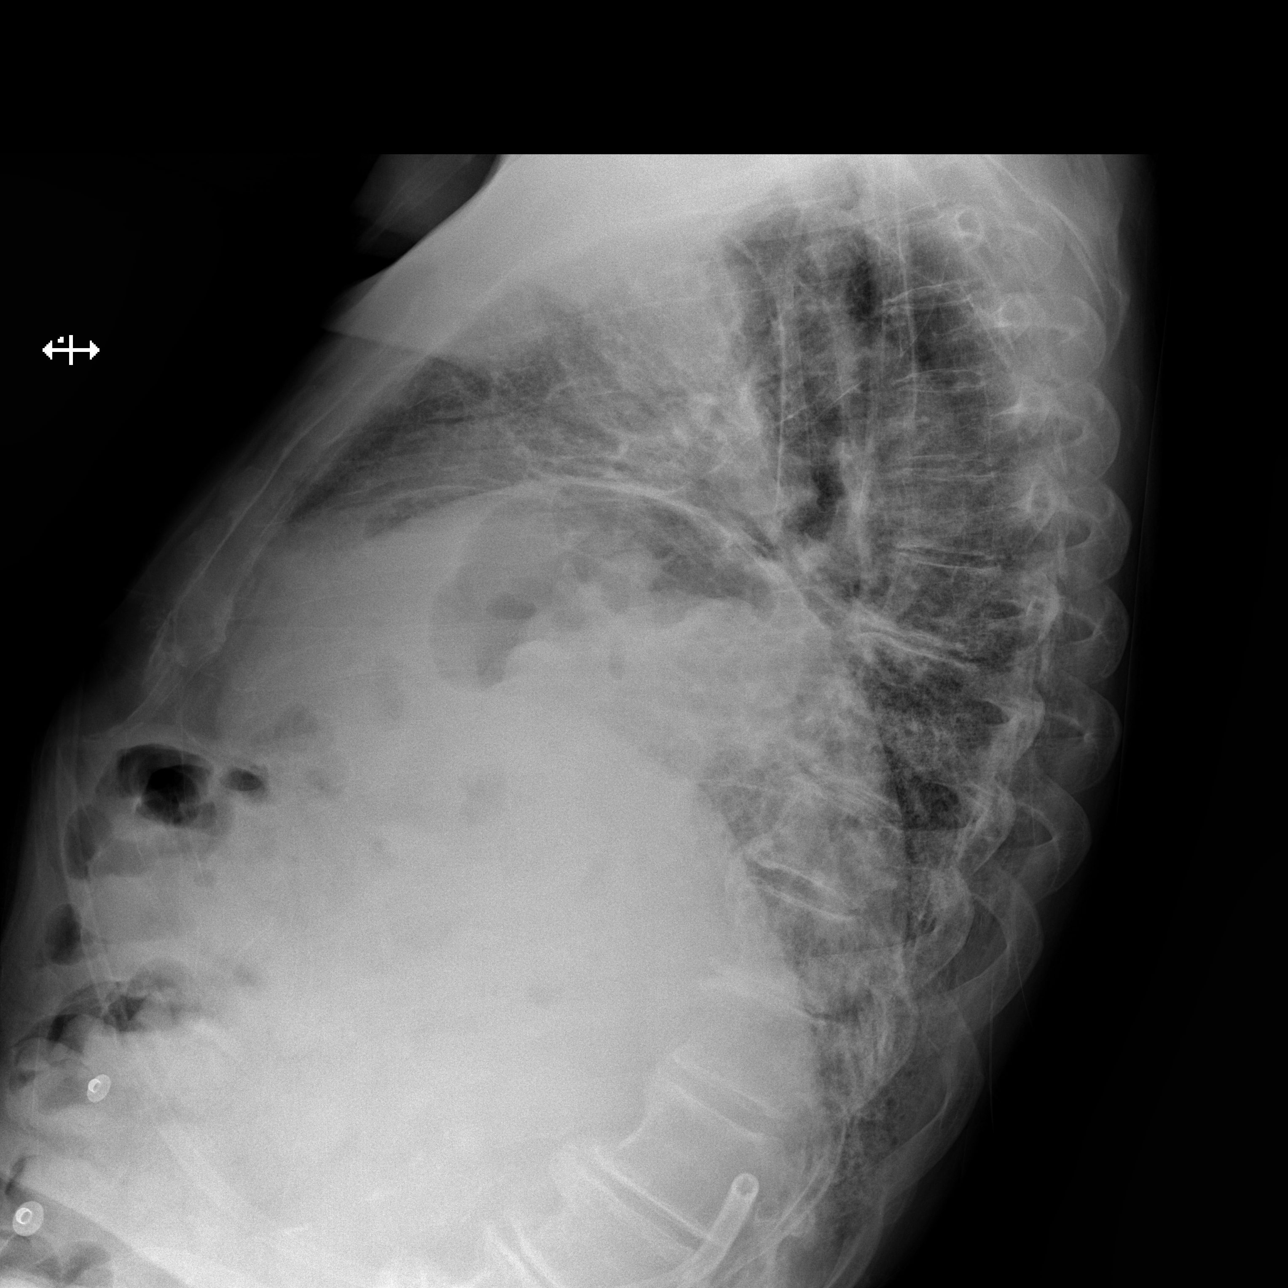

[3 of 3 positions shown; findings below may reference images not displayed]

PROCEDURE:     DXR - DXR CHEST PA (OR AP) AND LATERAL  - November 24, 2011  [DATE]

RESULT:     Comparison is made to the study 17 October, 2011 as well as
to an exam dated 13 October, 2011. Heart is difficult to evaluate but appears
to be grossly enlarged. There is pulmonary vascular congestion and diffuse
pulmonary edema with trace effusions. There appears to be elevation of the
left hemidiaphragm. Basilar atelectasis is present. No pneumothorax is
evident.
IMPRESSION: There appears to be significant cardiomegaly with pulmonary
vascular congestion and edema. Please see above.

## 2013-09-26 IMAGING — CR DG CHEST 1V PORT
1 series · 1 of 1 positions shown · non-contrast
Comparison: none

REASON FOR EXAM: CHF
COMMENTS:

[portable]
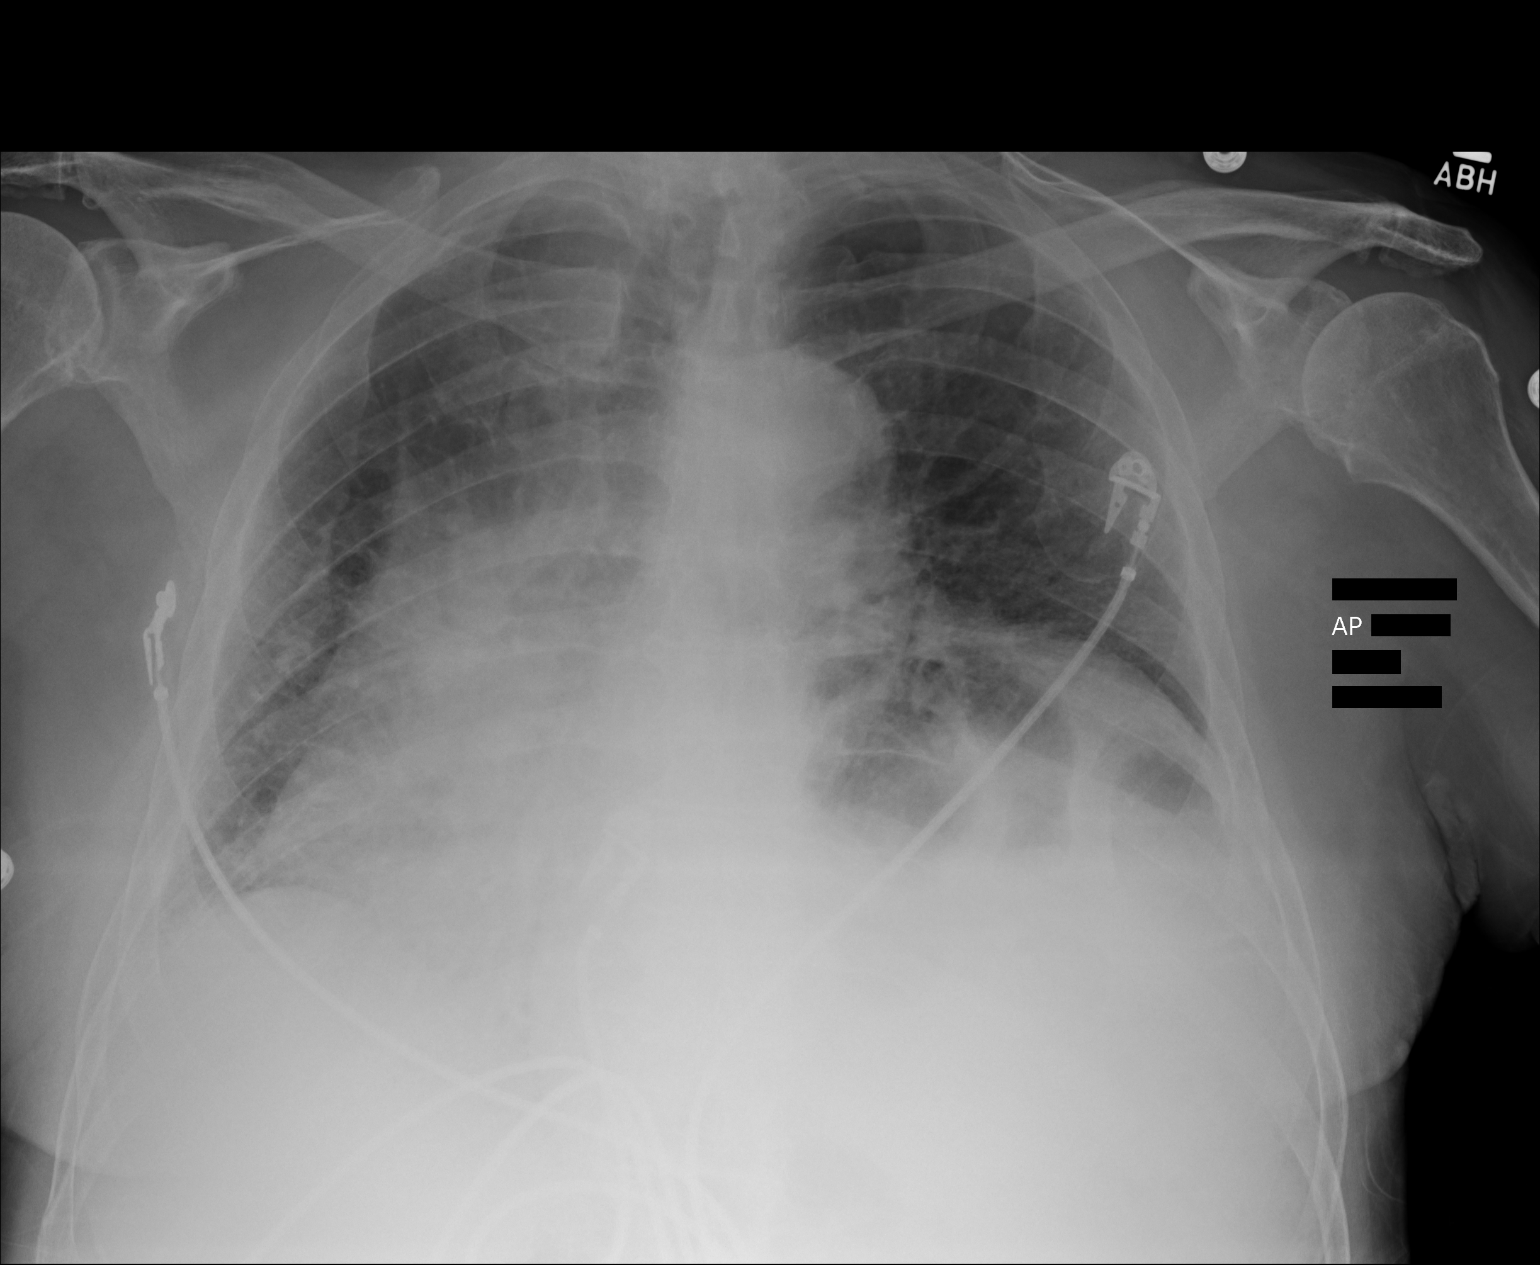

[1 of 1 positions shown; findings below may reference images not displayed]

PROCEDURE:     DXR - DXR PORTABLE CHEST SINGLE VIEW  - November 28, 2011  [DATE]

RESULT:     Comparison is made to the prior exam of 11/26/2011. There is
chronic elevation of the left hemidiaphragm with there being associated
displacement of the heart toward the right. This is a chronic finding. No
pleural effusion or frank pulmonary edema is identified. The heart appears
mildly enlarged. No new pulmonary infiltrates are seen as compared to the
prior exam.
IMPRESSION: 1. The previously noted changes of pulmonary vascular congestion are less
prominent.
2. No frank pulmonary edema or pleural effusion is seen.
3. No new pulmonary infiltrates are identified.

## 2014-12-28 NOTE — Consult Note (Signed)
Brief Consult Note: Diagnosis: afib with rapid ventricluar response.   Patient was seen by consultant.   Recommend further assessment or treatment.   Comments: Pt with history of afib and chf recently admitted with these diagnosis. Discharge to rehab. Pt was asymptomatic but care givers felt his heart rate was too fast and sent to er. Now with afib with variable ventricular repsonse. No chest pain. Mild trponin elevation likely secondary to demand. Pt currently in better rate control with cardizem . relatively hypotensive and on phenylephrine. Would attempt to weak the pressors. IV fluid bolus. full note to follow.  Electronic Signatures: Dalia HeadingFath, Katena Petitjean A (MD)  (Signed 22-Mar-13 16:58)  Authored: Brief Consult Note   Last Updated: 22-Mar-13 16:58 by Dalia HeadingFath, Kerry Chisolm A (MD)

## 2014-12-28 NOTE — Consult Note (Signed)
General Aspect patient is an 81 year old male with history of chronic atrial fibrillation and chronic anticoagulation was recently discharged from the hospital and placement in a care facility. He was noted by his care facility to have rapid heart rate. He was transferred back emergency room for evaluation of this. He denies syncope or presyncope. He is a difficult historian but denied chest pain. He does not appear to be aware of a rapid heart rate. He was noted to have atrial fibrillation rapid ventricular response. He was also noted to be relatively hypotensive  requiring Neo-Synephrine drip. he denies chest pain, syncope or presyncope.   Physical Exam:   GEN WD, disheveled, critically ill appearing    HEENT PERRL    NECK No masses    RESP clear BS  no use of accessory muscles    CARD Irregular rate and rhythm  Tachycardic  Murmur    Murmur Systolic    Systolic Murmur axilla    ABD denies tenderness  normal BS    LYMPH negative neck    EXTR negative cyanosis/clubbing, negative edema    SKIN normal to palpation    NEURO cranial nerves intact, motor/sensory function intact    PSYCH A+O to time, place, person   Review of Systems:   Subjective/Chief Complaint weakness and fatigue    General: Fatigue  Weakness    Skin: No Complaints    ENT: No Complaints    Eyes: No Complaints    Respiratory: No Complaints    Cardiovascular: No Complaints    Gastrointestinal: No Complaints    Genitourinary: No Complaints    Vascular: No Complaints    Musculoskeletal: No Complaints    Neurologic: No Complaints    Hematologic: No Complaints    Endocrine: No Complaints    Psychiatric: No Complaints    Review of Systems: All other systems were reviewed and found to be negative    Medications/Allergies Reviewed Medications/Allergies reviewed     Hypertension:   Home Medications: Medication Instructions Status  simvastatin 20 mg oral tablet 0.5 tab ( ) orally once  a day Active  gabapentin 100 mg oral capsule 2 cap(s) orally 3 times a day Active  paroxetine 10 mg oral tablet 1 tab(s) orally once a day Active  zolpidem 5 mg oral tablet 1 tab(s) orally once a day (at bedtime) Active  diltiazem 240 mg/24 hours oral capsule, extended release 1 cap(s) orally once a day Active  lisinopril 5 mg oral tablet 1 tab(s) orally once a day Active  Mag-Ox 400 oral tablet 1 tab(s) orally 2 times a day Active  furosemide 40 mg oral tablet 1 tab(s) orally once a day Active  metoprolol tartrate 25 mg oral tablet 0.5 tab(s) orally 2 times a day Active  warfarin 7.5 mg oral tablet 1 tab(s) orally once a day Active   EKG:   Interpretation atrial fibrillation with rapid ventricular response    No Known Allergies:     Impression 81 year old male with history of recently diagnosed chronic atrial fibrillation who was discharged to a assisted living environment on Coumadin and rate throat his atrial fibrillation. He was noted by caregivers rapid ventricular response to his heart rate. He was transported to the emergency room where he was noted to be in atrial fibrillation with rapid ventricular response. He is relatively hypotensive on presentation. He was placed on a Cardizem drip initially however his blood pressure not tolerate. This very well and he was transferred to the ICU where he was  placed on a Neo-Synephrine drip. He continues to remain tachycardic. He is also hypotensive requiring pressor support with Neo-Synephrine. He has ruled out for myocardial infarction and denies chest pain.    Plan 1. Continue to attempt to control rate with calcium channel blockers and/or beta blockers. Also IV digoxin can be tried to control heart rate should pressure not tolerate the aforementioned drugs. Consideration for amiodarone could also be raised to pressure tolerates 2. Continue chronic anticoagulation 3. Attempt to wean off Neo-Synephrine 4. Consider IV fluid bolus to determine if  Lyme depletion as part of the problem   Electronic Signatures: Dalia HeadingFath, Kenneth A (MD)  (Signed 24-Mar-13 15:18)  Authored: General Aspect/Present Illness, History and Physical Exam, Review of System, Past Medical History, Home Medications, EKG , Allergies, Impression/Plan   Last Updated: 24-Mar-13 15:18 by Dalia HeadingFath, Kenneth A (MD)

## 2014-12-28 NOTE — Discharge Summary (Signed)
PATIENT NAME:  Chris Lee, Chris Lee MR#:  [id number] DATE OF BIRTH:  October 01, 1925  DATE OF ADMISSION:  12/18/2011 DATE OF DISCHARGE:  12/26/2011  FINAL DIAGNOSES:  1. Acute on chronic respiratory failure.  2. Acute on chronic left-sided systolic congestive heart failure.  3. Atrial fibrillation.  4. Hyperlipidemia.  5. Depression.  6. History of spinal stenosis.  7. Allergic rhinitis.  8. Peripheral neuropathy.  9. Chronic insomnia.  10. History of cataract repair.  11. Status post appendectomy.  12. History of prior back surgery.  13. History of neck surgery.   HISTORY AND PHYSICAL: Please see dictated admission history and physical  HOSPITAL COURSE: The patient was admitted with increasing shortness of breath and signs of acute respiratory failure with evidence of progression of his chronic systolic left-sided congestive heart failure. He was aggressively diuresed, in fact his diuretics had to be decreased, but his respiratory status improved significantly with this treatment. Cardiac enzymes were followed, which were negative. His beta blocker and calcium channel blocker were adjusted for rate control and blood pressure control. He was not placed on an ACE inhibitor or angiotensin receptor blocker secondary to hypotension.   Physical therapy worked with the patient, and it became clear he would not be able to return to his prior level of care. There was recommendation made for a skilled nursing facility, and a bed search began. A bed became available, and at this time the patient will be discharged to that facility in stable condition with his physical activity to be up with assistance as tolerated. He will be on fall precautions and he will need daily weights. It was recommended that the nursing home physician be called for more than 2 pound gain in one day or 5 pounds in one week or increasing signs or symptoms of congestive heart failure. His diet should be no added salt, no concentrated  sweets. His blood sugars were followed during this hospitalization for hyperglycemia, however, they were principally below 25 and it is not felt that he needs daily sugars to monitor at this point as no action was taken on this. We will anticipate checking MET-B and CBC in one week with results to the nursing home physician. Physical therapy should evaluate and treat the patient.   DISCHARGE MEDICATIONS:  1. Cardizem CD 180 mg p.o. daily, to be held for systolic blood pressure less than 100 or heart rate less than 55.  2. Ambien 5 mg p.o. at bedtime.  3. Neurontin 200 mg p.o. three times daily. 4. Paxil 10 mg p.o. daily.  5. Simvastatin 10 mg p.o. at bedtime for hyperlipidemia.  6. Lopressor 25 mg p.o. twice a day, hold for systolic blood pressure less than 100 or heart rate less than 55.  7. Tessalon 200 mg p.o. every 8 hours p.r.n. cough.  8. Lasix 80 mg p.o. every other day.  9. Potassium 10 mEq p.o. every other day.  10. Coumadin 3 mg p.o. at bedtime.  11. Flonase two sprays to each nostril daily.  12. Oxygen 2 liters nasal cannula continuously.   CODE STATUS: The patient is DO NOT RESUSCITATE.  TIME SPENT: 45 minutes. ____________________________ Adin Hector, MD bjk:slb D: 12/26/2011 14:10:35 ET T: 12/26/2011 15:14:26 ET JOB#: [id number]  cc: Adin Hector, MD, <Dictator> Dion Body, MD Ramonita Lab MD ELECTRONICALLY SIGNED 12/30/2011 13:17

## 2014-12-28 NOTE — Discharge Summary (Signed)
PATIENT NAME:  Chris Lee, Chris Lee MR#:  [id number] DATE OF BIRTH:  1926/04/21  DATE OF ADMISSION:  10/13/2011 DATE OF DISCHARGE:  10/19/2011  DISCHARGE DIAGNOSES:  1. New onset atrial fibrillation.  2. New systolic congestive heart failure with ejection fraction 35%.  3. History of hyperlipidemia.  4. History of depression.  5. Nonambulatory with history of spinal stenosis.  6. History of insomnia.   DISCHARGE MEDICATIONS:  1. Gabapentin 200 mg p.o. t.i.d.  2. Paroxetine 10 mg p.o. daily.  3. Ambien 5 mg p.o. at bedtime p.r.n. for insomnia.  4. Diltiazem extended release 240 mg p.o. daily.  5. Lisinopril 5 mg p.o. daily.  6. Magnesium oxide 400 mg p.o. b.i.d.  7. Furosemide 40 mg p.o. daily.  8. Metoprolol 12.5 mg p.o. b.i.d.  9. Warfarin 7.5 mg p.o. daily.   CONSULT: Cardiology.   LABORATORY, DIAGNOSTIC AND RADIOLOGICAL DATA: Patient had a 2-D echocardiogram on 10/14/2011 showed ejection fraction of 35%.   Pertinent labs on discharge: Sodium 133, potassium 4.2, magnesium 1.7, creatinine 1.01, INR 1.3. Echo as stated above.   BRIEF HOSPITAL COURSE:  1. Atrial fibrillation. Patient initially came in stating that he felt bad with palpitations and found to be in new atrial fibrillation. He was started on Cardizem drip and also on heparin drip. He slowly transitioned to more stable rate, was transitioned over to oral Cardizem which he has responded well to. He was also started on metoprolol 12.5 mg for better rate control. His heart rate has remained stable and his blood pressure is more stable now. He was also started on Coumadin during his hospital stay with a goal of 2 to 3 for his INR. His INR and on day of discharge is 1.3. Will need to reassess the INR in two days and call that result to Dr. Burnadette PopLinthavong. Will continue 7.5 of warfarin at this time.  2. New diagnosis systolic congestive heart failure with ejection fraction of 35%. He was started on the metoprolol for cardiac protection,  also started on lisinopril for cardiac protection. Will be on Lasix 40 mg as stated daily.  3. Other chronic medical issues remained stable.   DISPOSITION: He is in stable condition to be discharged to [facility] for further skilled nursing and rehab.   DISCHARGE INSTRUCTIONS: He may remain on nasal cannula O2 per 2 liters if needed but if he sats above 92% he can come off of that. Will need INR in two days and call that to Dr. Burnadette PopLinthavong. Will follow up with Dr. Burnadette PopLinthavong in 1 to 2 weeks.   ____________________________ Marisue IvanKanhka Tashunda Vandezande, MD kl:cms D: 10/19/2011 15:37:58 ET T: 10/19/2011 16:16:38 ET JOB#: [id number]  cc: Marisue IvanKanhka Theophil Thivierge, MD, <Dictator> Marisue IvanKANHKA Amberlea Spagnuolo MD ELECTRONICALLY SIGNED 10/20/2011 7:31

## 2014-12-28 NOTE — H&P (Signed)
PATIENT NAME:  Chris Lee, Chris Lee MR#:  [id number] DATE OF BIRTH:  1926/04/21  DATE OF ADMISSION:  12/18/2011  PRIMARY CARE PHYSICIAN: Dr. Burnadette Pop  CHIEF COMPLAINT: Nurse heard noises in the lungs and shortness of breath.   HISTORY OF PRESENT ILLNESS: This is an 81 year old man with chronic respiratory failure on chronic oxygen secondary to congestive heart failure with low ejection fraction, atrial fibrillation, hyperlipidemia, depression and spinal stenosis. He was sent in to the ER because the nurse heard noises in the lungs and he was running a fever with sweats and chills. He was nauseated and spit up, dizzy and he has been sleeping a lot. In the Emergency Room, he was found to have an elevated BNP and an elevated white count. Unfortunately, I do not see a temperature documented. The patient is currently on BiPAP secondary to respiratory failure and hospitalist services were contacted for further evaluation.   PAST MEDICAL HISTORY:  1. Chronic respiratory failure on 2 liters. 2. Congestive heart failure with an ejection fraction of 35%. 3. Atrial fibrillation. 4. Hyperlipidemia. 5. Depression. 6. Spinal stenosis with neuropathy. 7. Insomnia.   PAST SURGICAL HISTORY:  1. Cataracts. 2. Appendectomy.  3. Two back surgeries.  4. One neck surgery.   MEDICATIONS:  1. Ambien 5 mg at bedtime.  2. Chlorhexidine gluconate p.r.n. mouthwash.  3. Diltiazem 300 mg daily extended-release. 4. Gabapentin 200 mg 3 times a day.  5. Magnesium oxide 400 mg twice a day.  6. Paxil 10 mg daily.  7. Warfarin 3 mg every day except for Tuesdays and Thursdays where he takes 5. 8. Zocor 10 mg at bedtime.  9. Metoprolol ER 50 mg twice a day.   SOCIAL HISTORY: Used to be a smoker, quit many years ago. Used to drink on a regular basis, but nothing recently. No drug use. Used to work for [employer] but now not working.   FAMILY HISTORY: Father died from lung cancer. Mother died of complications from emphysema.    REVIEW OF SYSTEMS: CONSTITUTIONAL: Positive for fever. Positive for chills. Positive for sweats. Positive for fatigue. Positive for weight loss. EYES: He does wear glasses. EARS, NOSE, MOUTH, AND THROAT: Decreased hearing. Positive for runny nose. CARDIOVASCULAR: Yesterday had chest pain. No palpitations. RESPIRATORY: Positive for shortness of breath. Positive for coughing up whitish to yellowish phlegm. GASTROINTESTINAL: Positive for nausea. Positive for vomiting. No abdominal pain. No diarrhea. No constipation. No bright red blood per rectum. No melena. GENITOURINARY: Positive for burning on urination. No hematuria. MUSCULOSKELETAL: No joint pain or muscle pain. INTEGUMENT: No rashes or eruptions but bruising on his lower legs. NEUROLOGIC: Decreased feeling in bilateral legs. PSYCHIATRIC: On medication for depression. ENDOCRINE: No thyroid problems. HEMATOLOGIC/LYMPHATIC: No anemia. No easy bruising or bleeding.   PHYSICAL EXAMINATION:  VITAL SIGNS: Temperature not documented, pulse 77, respirations 19, blood pressure 108/65, pulse oximetry 92% on BiPAP.   GENERAL: Positive for respiratory distress, using accessory muscles to breathe.   EYES: Conjunctivae and lids normal. Pupils equal, round, and reactive to light. Extraocular muscles intact. No nystagmus.   EARS, NOSE, MOUTH, AND THROAT: Tympanic membranes no erythema. Nasal mucosa no erythema. Throat no erythema. No exudate seen. Lips and gums normal.   NECK: Positive for JVD. No bruits. No lymphadenopathy. No thyromegaly. No thyroid nodules palpated.   RESPIRATORY: Positive use of accessory muscles to breathe. Decreased breath sounds bilaterally. Positive rales lower lung fields and wheeze upper lung fields.   CARDIOVASCULAR: S1, S2 normal. No gallops, rubs, or murmurs heard.  Carotid upstroke 2+ bilaterally. No bruits. Dorsalis pedis pulses 1+ bilaterally. No edema of the lower extremity.   ABDOMEN: Soft, nontender. No  organomegaly/splenomegaly. Normoactive bowel sounds. No masses felt.   LYMPHATIC: No lymph nodes in the neck.   MUSCULOSKELETAL: No cyanosis on BiPAP. No edema of the lower extremities.   SKIN: Bruising on the lower extremities with small ulcer on the left lower extremity.   NEUROLOGICAL: Cranial nerves II through XII grossly intact. Deep tendon reflexes difficult to ascertain bilateral lower extremities.   PSYCHIATRIC: Patient is oriented to person, place, and time.   LABORATORY, DIAGNOSTIC AND RADIOLOGICAL DATA: Troponin negative. White blood cell count 11.9, hemoglobin and hematocrit 12.0 and 36.7, platelet count 254, glucose 128, BUN 19, creatinine 0.89, sodium 138, potassium 4.2, chloride 103, CO2 21, calcium 8.9. Liver function tests: Total bilirubin 1.4, alkaline phosphatase 81, ALT 12, AST 24, total protein 7.5, albumin 3.1. BNP 9756. INR 1.5. EKG showed sinus rhythm, left axis deviation, interference ON V3, poor R wave progression.   ASSESSMENT AND PLAN:  1. Acute respiratory failure, most likely congestive heart failure, but cannot rule out pneumonia on chest x-ray. Patient on BiPAP to oxygenate. Patient is a DO NOT RESUSCITATE.  2. Acute systolic congestive heart failure with elevated BNP and increased peripheral vascular congestion on chest x-ray. Will give IV Lasix 40 mg IV q.12 hours. Patient is on beta blocker. Will watch to see if blood pressure can tolerate an ACE inhibitor.  3. Possibility of pneumonia. Wait for the official chest x-ray report. Patient has fever at home and been in the hospital recently. Will cover with Zyvox and Zosyn until x-ray report resulted. Will have the nurse document a temperature and obtain blood cultures.  4. Hyperlipidemia. Continue Zocor.  5. Depression. Paxil on hold while on Zyvox.  6. Atrial fibrillation. INR is low at 1.5. Increase Coumadin to 5 mg. Continue metoprolol and diltiazem for rate control.  7. Spinal stenosis with neuropathy.  Continue gabapentin.   TIME SPENT ON ADMISSION: 55 minutes.   CODE STATUS: Patient is a DO NOT RESUSCITATE.   ____________________________ Herschell Dimesichard J. Renae GlossWieting, MD rjw:cms D: 12/18/2011 20:22:07 ET T: 12/19/2011 06:04:11 ET JOB#: [id number]  cc: Herschell Dimesichard J. Renae GlossWieting, MD, <Dictator> Marisue IvanKanhka Linthavong, MD Salley ScarletICHARD J Yanuel Tagg MD ELECTRONICALLY SIGNED 12/19/2011 13:54

## 2014-12-28 NOTE — H&P (Signed)
PATIENT NAME:  Chris Lee, Chris Lee MR#:  [id number] DATE OF BIRTH:  08-20-1926  DATE OF ADMISSION:  11/24/2011  PRIMARY CARE PHYSICIAN: Dr. Burnadette PopLinthavong   CARDIOLOGIST: Dr. Gwen PoundsKowalski   CHIEF COMPLAINT: Increased heart rate.   HISTORY OF PRESENT ILLNESS: Chris Lee is an 81 year old Caucasian gentleman who was just admitted recently on February 7th and discharged February 13th with rapid atrial fibrillation and new onset congestive heart failure, systolic, discharged to [facility] for rehab. He went home this past Monday and was getting physical therapy who did his vitals this afternoon and found that the patient's heart rate was in the 120's to 150's. She called the EMS who found the patient to be in rapid atrial fibrillation, acute on chronic. The patient was himself asymptomatic. Denies any shortness of breath or chest pain. He was brought to the Emergency Room. His heart rate is anywhere from 94 to 150. The patient again is asymptomatic. He received one dose of Cardizem and his heart rate has not made much difference. He was also found clinically to be in heart failure along with radiological findings suggestive of pulmonary edema causing him to be hypotensive along with congestive heart failure in rapid atrial fibrillation. He is being admitted for further evaluation and management. The patient denies any fever, cough, or any sinus or upper respiratory tract infection.   PAST MEDICAL HISTORY:  1. Atrial fibrillation, chronic, diagnosed in February of 2013.  2. Congestive heart failure, systolic, diagnosed in February 2013 with EF of 35%.  3. Hyperlipidemia.  4. Depression.  5. History of spinal stenosis.  6. History of insomnia.  7. Diet controlled diabetes.  MEDICATIONS:  1. Gabapentin 200 mg t.i.d.  2. Paxil 10 mg daily.  3. Ambien 5 mg at bedtime p.r.n.  4. Diltiazem extended-release 240 mg p.o. daily.  5. Lisinopril 5 mg daily.  6. Magnesium oxide 400 mg p.o. b.i.d.   7. Lasix 40 mg daily.  8. Metoprolol 12.5 b.i.d.  9. Warfarin 7.5 mg daily.   PAST SURGICAL HISTORY:  1. Cataract extractions.  2. Spinal stenosis surgery x3.  3. Appendectomy.   SOCIAL HISTORY: Used to be a smoker, quit 50 years ago. Does drink about 3 to 4 drinks on a regular basis. Has not drank in about a month or so. No illicit drug use.    FAMILY HISTORY: Father died from lung cancer. Mother died from complications of emphysema.   REVIEW OF SYSTEMS: CONSTITUTIONAL: Positive for weakness. No fever. EYES: No blurred or double vision. ENT: No tinnitus, ear pain, or hearing loss. RESPIRATORY: Positive for shortness of breath. CARDIOVASCULAR: No chest pain. No palpitations. Positive for hypertension. GI: No nausea, vomiting, diarrhea, or abdominal pain. GU: No dysuria or hematuria. ENDOCRINE: No polyuria or nocturia. HEMATOLOGY: No anemia or easy bruising. SKIN: No acne or rash. MUSCULOSKELETAL: Positive for arthritis and spinal stenosis. NEUROLOGIC: No CVA or TIA. PSYCH: No anxiety or depression. All other systems reviewed and negative.   LABORATORY, DIAGNOSTIC, AND RADIOLOGICAL DATA: Cardiac enzymes negative. Comprehensive metabolic panel within normal limits except BUN of 20 and albumin of 3.2, hemoglobin and hematocrit 12.5 and 38.3, white count 7.2. PT-INR 23.1 and 2. Troponin is less than 0.02. Chest x-ray is consistent with cardiomegaly with pulmonary vascular congestion and edema. There appears to be elevation of left hemidiaphragm. EKG shows rapid atrial fibrillation.   ASSESSMENT: 81 year old Chris Lee with:  1. Rapid atrial fibrillation, acute on chronic, on Coumadin. INR is therapeutic. The patient, however, is  asymptomatic. It was an incidental note made by physical therapist at home today that the patient was having a rapid heart rate. His heart rate is anywhere from 94 to 150 along with hypotension.  2. Acute on chronic congestive heart failure, suspected systolic, EF of 35% by  echo of February 2013.  3. Hypotension in the setting of rapid atrial fibrillation and congestive heart failure.  4. History of spinal stenosis.  5. Chronically elevated left hemidiaphragm noted on chest x-ray.   PLAN:  1. Admit patient to Intensive Care Unit.  2. Will start patient on diltiazem drip.  3. Give IV Lasix around-the-clock if blood pressure allows.  4. We might have to start the patient on some pressors in the setting of hypotension along with rapid atrial fibrillation and congestive heart failure.  5. Continue home medications which is warfarin, Mag-Ox, Paxil, and gabapentin.  6. I will hold off on beta-blockers since blood pressure is on the lower side.  7. Cardiology consultation in the morning.  8. Cycle cardiac enzymes x3.  9. At present the patient does not appear to be septic and does not seem to have any source of infection at present. His white count is normal. He does not have cough, dysuria, abdominal pain, and he is not having fever either.   Plan for admission was discussed with the patient and his wife. Further work-up according to the patient's clinical course.   CRITICAL TIME SPENT: 50 minutes.   ____________________________ Wylie Hail Allena Katz, MD sap:drc D: 11/24/2011 22:16:30 ET T: 11/25/2011 06:59:49 ET JOB#: [id number]  cc: Florance Paolillo A. Allena Katz, MD, <Dictator> Marisue Ivan, MD Lamar Blinks, MD Willow Ora MD ELECTRONICALLY SIGNED 12/12/2011 6:34

## 2014-12-28 NOTE — Discharge Summary (Signed)
PATIENT NAME:  Chris Lee, WINGARD MR#:  [id number] DATE OF BIRTH:  [birth year]  DATE OF ADMISSION:  11/24/2011 DATE OF DISCHARGE:  12/01/2011  DISCHARGE DIAGNOSES:  1. Atrial fibrillation with rapid ventricular response.  2. Hypotension.  3. Systolic congestive heart failure with ejection fraction of 35%, in February 2013.  4. Depression. 5. History of spinal stenosis.   DISCHARGE MEDICATIONS:  1. Simvastatin 20 mg 1/2 tab p.o. daily.  2. Gabapentin 100 mg capsule 2 capsules p.o. three times daily. 3. Paroxetine 10 mg p.o. daily.  4. Diltiazem extended release 300 mg p.o. daily.  5. Warfarin 2.5 mg p.o. daily.   MEDICATIONS TO HOLD: Lasix.   CONSULTANTS: Serafina Royals, MD - Cardiology.  PROCEDURES: None.   PERTINENT LABS: On the day of discharge, sodium 135, potassium 4.4, and creatinine 1.05. INR 2.8.   BRIEF HOSPITAL COURSE:  1. Atrial fibrillation with rapid ventricular response: The patient initially came in with uncontrolled tachycardia due to his underlying atrial fibrillation. He was initially placed on a diltiazem drip and then converted over to oral diltiazem. He was also placed temporarily on amiodarone was visited by Dr. Nehemiah Massed who recommended titrating up on long acting diltiazem. He is currently on 300 and his heart rate has been more stable in the 80s. We will continue on this regimen for now and will hold off on further beta blockers at this time. The goal is to keep the heart rate 110 or less.  2. Hypotension: The patient initially came in with hypotension and was placed in the Intensive Care Unit where he was placed on a vasopressor drip. He was titrated off of that successfully and his blood pressures have remained stable. We have had to hold his other blood pressure medications due to this issue. We will hold the Lasix for now. We will need to follow on his volume status as an outpatient and we will likely need to restart the Lasix.  3. History of systolic congestive  heart failure with ejection fraction of 35%, taken in February 20013: It appears to have remained stable. He is not volume overloaded. We will need to add back an ACE inhibitor at some point when his blood pressure improves.  4. His other chronic medical issues remained stable. No changes to those medications.   DISPOSITION: He is in stable condition to be discharged to home. He has hospice care available at home. His wife has agreed to provide additional care for him along with other family members. He declines rehab referral. He will need follow-up in one week with Dr. Netty Starring. We will request a MET-B and INR prior to that hospital followup. Followup with Dr. Nehemiah Massed in 2 weeks. ____________________________ Dion Body, MD kl:slb D: 12/01/2011 08:05:54 ET T: 12/01/2011 14:06:30 ET JOB#: [id number]  cc: Dion Body, MD, <Dictator> Dion Body MD ELECTRONICALLY SIGNED 12/07/2011 18:02

## 2014-12-28 NOTE — H&P (Signed)
PATIENT NAME:  Chris Lee, Chris Lee MR#:  [id number] DATE OF BIRTH:  1926/03/06  DATE OF ADMISSION:  10/13/2011  PRIMARY CARE PHYSICIAN: Dr. Burnadette PopLinthavong  CHIEF COMPLAINT: Dizziness, weakness and palpitations.   HISTORY OF PRESENT ILLNESS: This is an 81 year old male who apparently has not been feeling well for the past month. He describes his feeling as being more weak and lethargic, also feeling short of breath and also lightheaded and dizzy. Patient says that he thought his symptoms would get better but they were not improving. Today [facility] to [facility] to get evaluated and on EKG was noted to be in supraventricular tachycardia with heart rates in the 170s to 180s. He was urgently sent over to the Emergency Room and noted to be in atrial fibrillation. Patient given one dose of IV Cardizem and his heart rate has slowed down but still remains in atrial fibrillation. Patient does admit to some palpitations on and off for the past month. He denies any chest pain. He does admit to shortness of breath which has progressively gotten worse and worse with some exertion. Patient does not really ambulate very well given his history of spinal stenosis, uses a motorized scooter to get around at home.   REVIEW OF SYSTEMS: CONSTITUTIONAL: No documented fever. No weight gain. No weight loss. EYES: No blurry or double vision. ENT: No tinnitus. No postnasal drip. No redness of the oropharynx. RESPIRATORY: No cough, no wheeze, no hemoptysis. Positive dyspnea. CARDIOVASCULAR: No chest pain. No orthopnea. Positive palpitations. No syncope. GASTROINTESTINAL: Positive nausea. No vomiting. No diarrhea. No abdominal pain. No melena. No hematochezia. GENITOURINARY: No dysuria. No hematuria. ENDOCRINE: No polyuria. No nocturia. No heat or cold intolerance. HEME: No anemia, no bruising, no bleeding. INTEGUMENTARY: No rashes. No lesions. MUSCULOSKELETAL: No arthritis, no swelling, no gout. NEUROLOGIC: No numbness. No tingling.  No ataxia. No seizure-type activity. PSYCH: No anxiety, no insomnia, no ADD.   PAST MEDICAL HISTORY:  1. Hypertension. 2. Hyperlipidemia. 3. Spinal stenosis. 4. Diet-controlled diabetes. 5. Depression. 6. History of prostate cancer.   PAST SURGICAL HISTORY:  1. Cataract extractions. 2. Spinal stenosis surgeries x3.  3. Appendectomy.   ALLERGIES: No known drug allergies.   SOCIAL HISTORY: Used to be a smoker, quit about 50+ years ago. Does drink about 3 to 4 drinks on a regular basis but has not had a drink in about a month. No illicit drug abuse. Lives at home by himself.   FAMILY HISTORY: Father died from lung cancer and mother died from complications of emphysema.   CURRENT MEDICATIONS:  1. Zocor 10 mg at bedtime.  2. Gabapentin 200 mg t.i.d.  3. Paxil 10 mg daily.   PHYSICAL EXAMINATION ON ADMISSION:  VITAL SIGNS: Temperature 98.2, pulse is irregular at 110s, respirations 22, blood pressure 116/92, sats 92% on 2 liters nasal cannula.   GENERAL: He is a pleasant appearing male in no apparent distress.   HEENT: He is atraumatic, normocephalic. His extraocular muscles are intact. His pupils are equal, reactive to light. His sclerae is anicteric. No conjunctival injection. No oropharyngeal erythema.   NECK: Supple. There is no jugular venous distention, no bruits, no lymphadenopathy, no thyromegaly.   HEART: Irregular. No murmurs, no rubs, no clicks.   LUNGS: He has some coarse bibasilar crackles, otherwise negative use of accessory muscles. No dullness to percussion.   ABDOMEN: Soft, flat, nontender, nondistended. Has good bowel sounds. No hepatosplenomegaly appreciated.   EXTREMITIES: No evidence of any cyanosis, clubbing, or peripheral edema. Has +  2 pedal and radial pulses bilaterally.   NEUROLOGIC: Patient is alert, awake, oriented x3 with no focal motor or sensory deficits appreciated bilaterally.   SKIN: Moist, warm with no rash appreciated.   LYMPHATIC: There is  no cervical or axillary lymphadenopathy.   LABORATORY, DIAGNOSTIC, AND RADIOLOGICAL DATA: Serum glucose 108, BUN 39, creatinine 1.7, sodium 140, potassium 3.1, chloride 98, bicarbonate 27, AST 301, ALT 403, alkaline phosphatase 64, total albumin 3.9, total bilirubin 2.4. Troponin 0.05, CK total 181, TSH 1.82. White cell count 9, hemoglobin 14.8, hematocrit 45.2, platelet count 242.   Patient did have an EKG done which showed atrial fibrillation with rapid ventricular response.   ASSESSMENT AND PLAN: This is an 81 year old male with past medical history of hypertension, hyperlipidemia, history of spinal stenosis, history of prostate cancer presents to the hospital with dizziness, weakness, and palpitations and noted to be in new onset atrial fibrillation.  1. New onset atrial fibrillation. Etiology currently unclear but seems like this has been a problem for the patient for a while as he has had symptoms for about a month but has not sought any attention. He has received one dose of IV Cardizem in the Emergency Room. I will continue pulsed doses of IV Cardizem if heart rate is greater than 140. Will start him on some p.o. Cardizem for now. I will also get a two-dimensional echocardiogram, get a cardiology consult. Patient's TSH is normal. I will also go ahead and start him on anticoagulation and start heparin nomogram.  2. Acute renal failure. This is likely secondary to dehydration and new onset atrial fibrillation. I will gently hydrate him with IV fluids, follow his BUN and creatinine. Renal dose his medications and avoid nephrotoxins for now.  3. Hypertension. Patient presently is not taking any medications but will be started on Cardizem for his atrial fibrillation. I will follow his blood pressure closely.  4. Abnormal liver function tests. The etiology of this is currently unclear. He does have a history of alcohol abuse but he quit about a month or so ago. I will check an abdominal ultrasound and  also check a hepatitis profile. Hold his Zocor for now and follow his LFTs in the morning.  5. Anxiety. Will continue with Paxil.  6. Hyperlipidemia. I am holding his Zocor given his abnormal liver function tests.  7. Hypokalemia. Will go ahead and replace his potassium accordingly and follow it. His magnesium level is normal.  8. CODE STATUS: Patient is a FULL CODE.   TIME SPENT: 50 minutes.   ____________________________ Rolly Pancake. Cherlynn Kaiser, MD vjs:cms D: 10/13/2011 17:49:17 ET T: 10/14/2011 05:28:28 ET JOB#: [id number]  cc: Rolly Pancake. Cherlynn Kaiser, MD, <Dictator> Marisue Ivan, MD Houston Siren MD ELECTRONICALLY SIGNED 10/17/2011 3:14
